# Patient Record
Sex: Female | Born: 1990 | Race: White | Hispanic: No | Marital: Single | Smoking: Never smoker
Health system: Southern US, Community
[De-identification: ages and names within clinical notes are randomized; demographics above are authoritative.]

---
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
## 2017-10-30 ENCOUNTER — Emergency Department (HOSPITAL_COMMUNITY)
Admission: EM | Admit: 2017-10-30 | Discharge: 2017-10-30 | Disposition: A | Discharge status: Home / Self Care | Payer: BLUE CROSS/BLUE SHIELD | Attending: Emergency Medicine | Admitting: Emergency Medicine

## 2017-10-30 ENCOUNTER — Encounter (HOSPITAL_COMMUNITY): Payer: Self-pay | Admitting: Emergency Medicine

## 2017-10-30 ENCOUNTER — Emergency Department (HOSPITAL_COMMUNITY): Payer: BLUE CROSS/BLUE SHIELD

## 2017-10-30 DIAGNOSIS — M25571 Pain in right ankle and joints of right foot: Secondary | ICD-10-CM | POA: Insufficient documentation

## 2017-10-30 DIAGNOSIS — M79671 Pain in right foot: Secondary | ICD-10-CM

## 2017-10-30 NOTE — ED Notes (Signed)
Patient able to ambulate independently with and without crutches  

## 2017-10-30 NOTE — ED Notes (Signed)
Ortho tech paged  

## 2017-10-30 NOTE — ED Triage Notes (Signed)
Pt presents to ED after falling through the toilet lit into the toilet in mid April.  Seen by orthopedist for foot, has had an MRI and xray and told "there's nothing wrong with your foot".  Patient states she continues to have swelling, regardless of whether she's on it or not.

## 2017-10-30 NOTE — ED Notes (Signed)
Ortho responded, coming to see patient

## 2017-10-30 NOTE — Progress Notes (Signed)
Orthopedic Tech Progress Note Patient Details:  Kathi LudwigJessica A Blodgett [birth year] [id number]  Ortho Devices Type of Ortho Device: ASO, Crutches Ortho Device/Splint Location: RLE Ortho Device/Splint Interventions: Ordered, Application, Adjustment   Post Interventions Patient Tolerated: Well Instructions Provided: Care of device   Jennye MoccasinHughes, Shauntea Lok Craig 10/30/2017, 10:34 PM

## 2017-10-30 NOTE — ED Provider Notes (Signed)
[facility]   CSN: [id number] Arrival date & time: 10/30/17  2048     History   Chief Complaint Chief Complaint  Patient presents with  . Foot Pain    HPI Melissa Nash is a 27 y.o. female with no significant past medical history presents today for evaluation of acute onset, waxing and waning right foot and ankle pain and swelling for 2 months.  On 08/25/2017 she sustained an injury in which her feet got caught in a toilet.  She was seen and evaluated on that day in the emergency department with complaint of right foot and ankle pain with no acute osseous abnormalities on x-ray but some soft tissue swelling noted.  She states that since then she has [place] and underwent [place] CT scan but states "they never told me with a shot".  She has not attempted to contact the group.  Since the injury she has had waxing and waning right ankle and foot pain and swelling.  Pain is localized to the dorsum of the right foot as well as the right heel and the medial aspect of the right ankle.  Pain worsens with ambulation and palpation.  Pain improves temporarily with Flector patches, NSAIDs, Tylenol, Epsom salts soaks and heating pads.  Also had some improvement with a foot brace but states that it got dirty so she stopped using it.  She denies fevers or chills.  She notes intermittent numbness and tingling of her toes which occurs primarily when she elevates the extremity.  Denies recent travel or surgeries, no hemoptysis, no prior history of DVT or PE.  She is on the Mirena IUD, no estrogen-based OCPs.  She did sustain a superficial sunburn to the bilateral lower extremities after kayaking on Saturday 5 days ago but states this is not of concern to her today.  The history is provided by the patient.    History reviewed. No pertinent past medical history.  There are no active problems to display for this  patient.   History reviewed. No pertinent surgical history.   OB History   None      Home Medications    Prior to Admission medications   Not on File    Family History History reviewed. No pertinent family history.  Social History Social History   Tobacco Use  . Smoking status: Never Smoker  . Smokeless tobacco: Never Used  Substance Use Topics  . Alcohol use: Not Currently  . Drug use: Never     Allergies   Penicillins   Review of Systems Review of Systems  Constitutional: Negative for chills and fever.  Respiratory: Negative for shortness of breath.   Cardiovascular: Positive for leg swelling. Negative for chest pain.  Neurological: Positive for numbness. Negative for syncope and weakness.     Physical Exam Updated Vital Signs BP (!) 128/92 (BP Location: Right Arm)   Pulse 76   Temp 98.5 F (36.9 C) (Oral)   Resp 18   SpO2 99%   Physical Exam  Constitutional: She appears well-developed and well-nourished. No distress.  HENT:  Head: Normocephalic and atraumatic.  Eyes: Conjunctivae are normal. Right eye exhibits no discharge. Left eye exhibits no discharge.  Neck: No JVD present. No tracheal deviation present.  Cardiovascular: Normal rate and intact distal pulses.  2+ radial and DP/PT pulses bl, negative Homan's bl, no lower extremity edema   Pulmonary/Chest: Effort normal.  Abdominal: She exhibits  no distension.  Musculoskeletal: Normal range of motion. She exhibits tenderness. She exhibits no edema.  There is tenderness to palpation of the right foot at the heel, the dorsum of the third and fourth metatarsals no deformity, crepitus, ecchymosis, or erythema noted.  5/5 strength BLE major muscle groups.  No varus or valgus instability.  Mild medial malleolar tenderness of the right ankle noted  Neurological: She is alert. She exhibits normal muscle tone.  Fluent speech with no evidence of dysarthria or aphasia, no facial droop, mildly altered  sensation to soft touch of the dorsum of the right foot.  Ambulatory with mildly antalgic gait but good balance, able to Heel Walk and Toe Walk without difficulty.  Skin: Skin is warm and dry. There is erythema.  Diffuse erythema to the anterior aspect of the bilateral lower extremities consistent with superficial sunburn.  No blisters.  Rash blanches.  No excoriations  Psychiatric: She has a normal mood and affect. Her behavior is normal.  Nursing note and vitals reviewed.    ED Treatments / Results  Labs (all labs ordered are listed, but only abnormal results are displayed) Labs Reviewed - No data to display  EKG None  Radiology Dg Ankle Complete Right  Result Date: 10/30/2017 CLINICAL DATA:  Right ankle pain for several months after fall. EXAM: RIGHT ANKLE - COMPLETE 3+ VIEW COMPARISON:  None. FINDINGS: There is no evidence of fracture, dislocation, or joint effusion. There is no evidence of arthropathy or other focal bone abnormality. Soft tissues are unremarkable. IMPRESSION: Negative. Electronically Signed   By: Tollie Eth M.D.   On: 10/30/2017 22:00   Dg Foot Complete Right  Result Date: 10/30/2017 CLINICAL DATA:  Medial right foot pain and swelling x2 months. EXAM: RIGHT FOOT COMPLETE - 3+ VIEW COMPARISON:  None. FINDINGS: Normal bone mineralization. No marginal nor extra articular erosions. No periostitis. No acute fracture nor suspicious osseous lesions. Soft tissues are unremarkable. IMPRESSION: No acute osseous abnormality of the right foot. Electronically Signed   By: Tollie Eth M.D.   On: 10/30/2017 21:59    Procedures Procedures (including critical care time)  Medications Ordered in ED Medications - No data to display   Initial Impression / Assessment and Plan / ED Course  I have reviewed the triage vital signs and the nursing notes.  Pertinent labs & imaging results that were available during my care of the patient were reviewed by me and considered in my medical  decision making (see chart for details).     Patient with right ankle and foot pain waxing and waning for 2 months.  Initial evaluation at an outside hospital showed no acute osseous abnormalities.  She is afebrile, vital signs are stable.  She is nontoxic in appearance, neurovascularly intact.  Pain appears to be osseous in nature, no concern for DVT, compartments are soft.  She has a superficial/first-degree sunburn to the bilateral lower extremities, not consistent with cellulitis.  No concern for septic joint.  She did undergo CT scan with an orthopedic group in [place] but never followed up on the results.  She is ambulatory on the extremity despite pain.  Repeat radiographs today show no acute osseous abnormalities.  There is some concern for possible plantar fasciitis given heel pain on examination.  She was given brace and crutches for comfort, encourage continued use of NSAIDs, Tylenol, ice, and elevation.  She will follow-up with an orthopedist for reevaluation of her symptoms.  Discussed strict ED return precautions.  Patient and  patient's significant other verbalized understanding of and agreement with plan and patient stable for discharge home at this time.  Final Clinical Impressions(s) / ED Diagnoses   Final diagnoses:  Acute right ankle pain  Right foot pain    ED Discharge Orders    None       Bennye Alm 10/31/17 1507    Benjiman Core, MD 11/01/17 1512

## 2017-10-30 NOTE — Discharge Instructions (Addendum)
Your x-rays today did not show any fractures.  Your physical examination is somewhat suggestive of plantar fasciitis.  You may alternate 600 mg of ibuprofen and [phone] mg of Tylenol every 3 hours as needed for pain for the next few days. Do not exceed 4000 mg of Tylenol daily.  Take ibuprofen with food to avoid upset stomach issues.  Do some gentle stretching exercises in the morning and throughout the day to help with your pain.  You can freeze a water bottle and then roll it gently over the bottom of your foot.  I have also attached instructions on how to tape your feet which can help ease your pain.  Wear the CAM walker/brace for comfort.  Crutches to help avoid weightbearing.  Follow-up with orthopedics for reevaluation of your symptoms.   Return to the emergency department if any concerning signs or symptoms develop such as fevers, severe swelling, loss of pulses or pallor to the extremity, or persistent numbness/weakness.

## 2017-10-30 NOTE — ED Notes (Signed)
Xray at bedside to transport patient.

## 2019-07-04 IMAGING — CR DG ANKLE COMPLETE 3+V*R*
3 series · 3 of 3 positions shown · non-contrast
Comparison: None.

CLINICAL DATA: Right ankle pain for several months after fall.

EXAM:
RIGHT ANKLE - COMPLETE 3+ VIEW

[ankle ap]
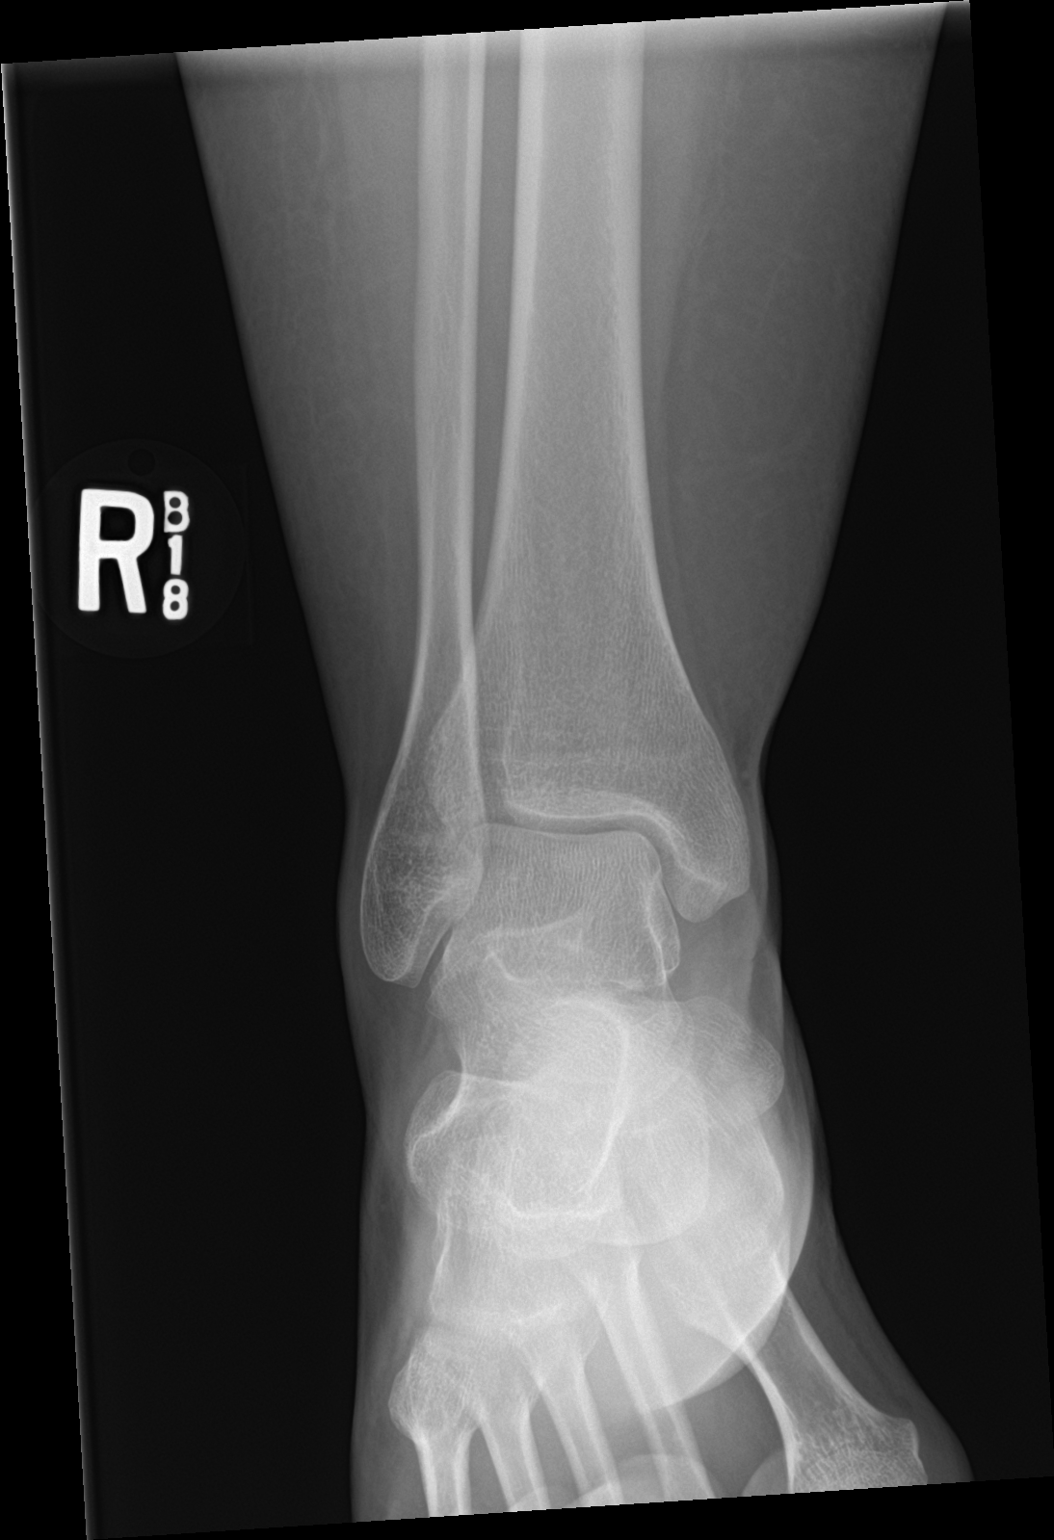

[ankle obl]
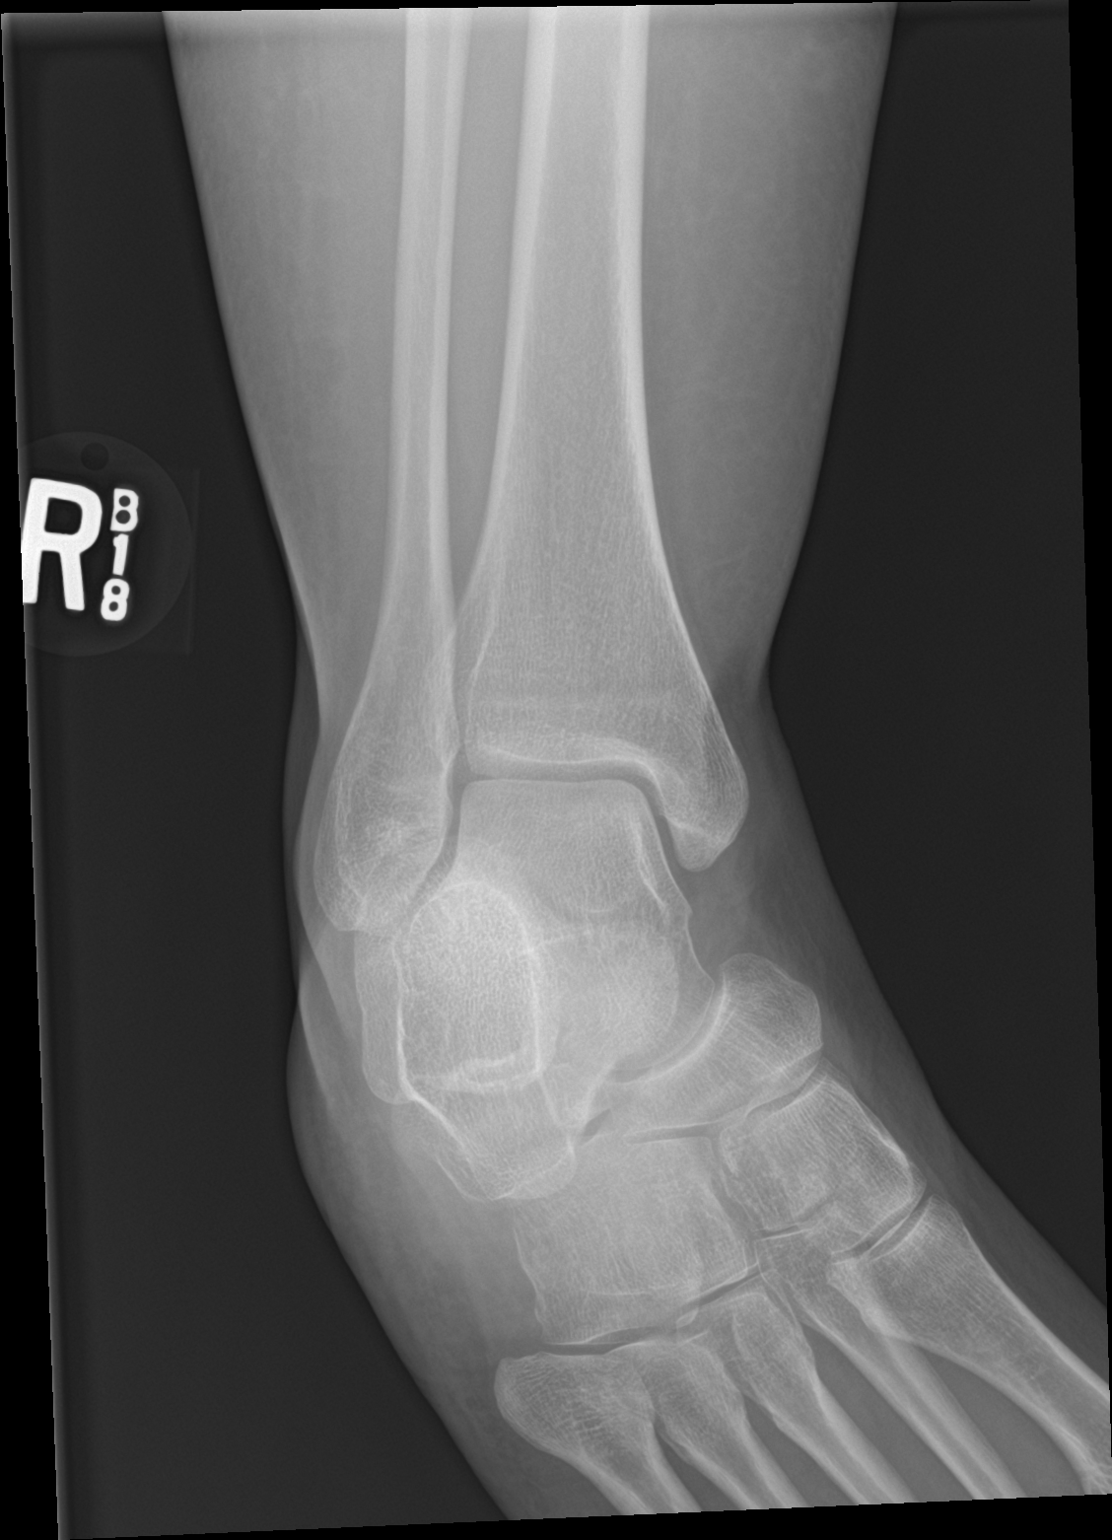

[ankle lat]
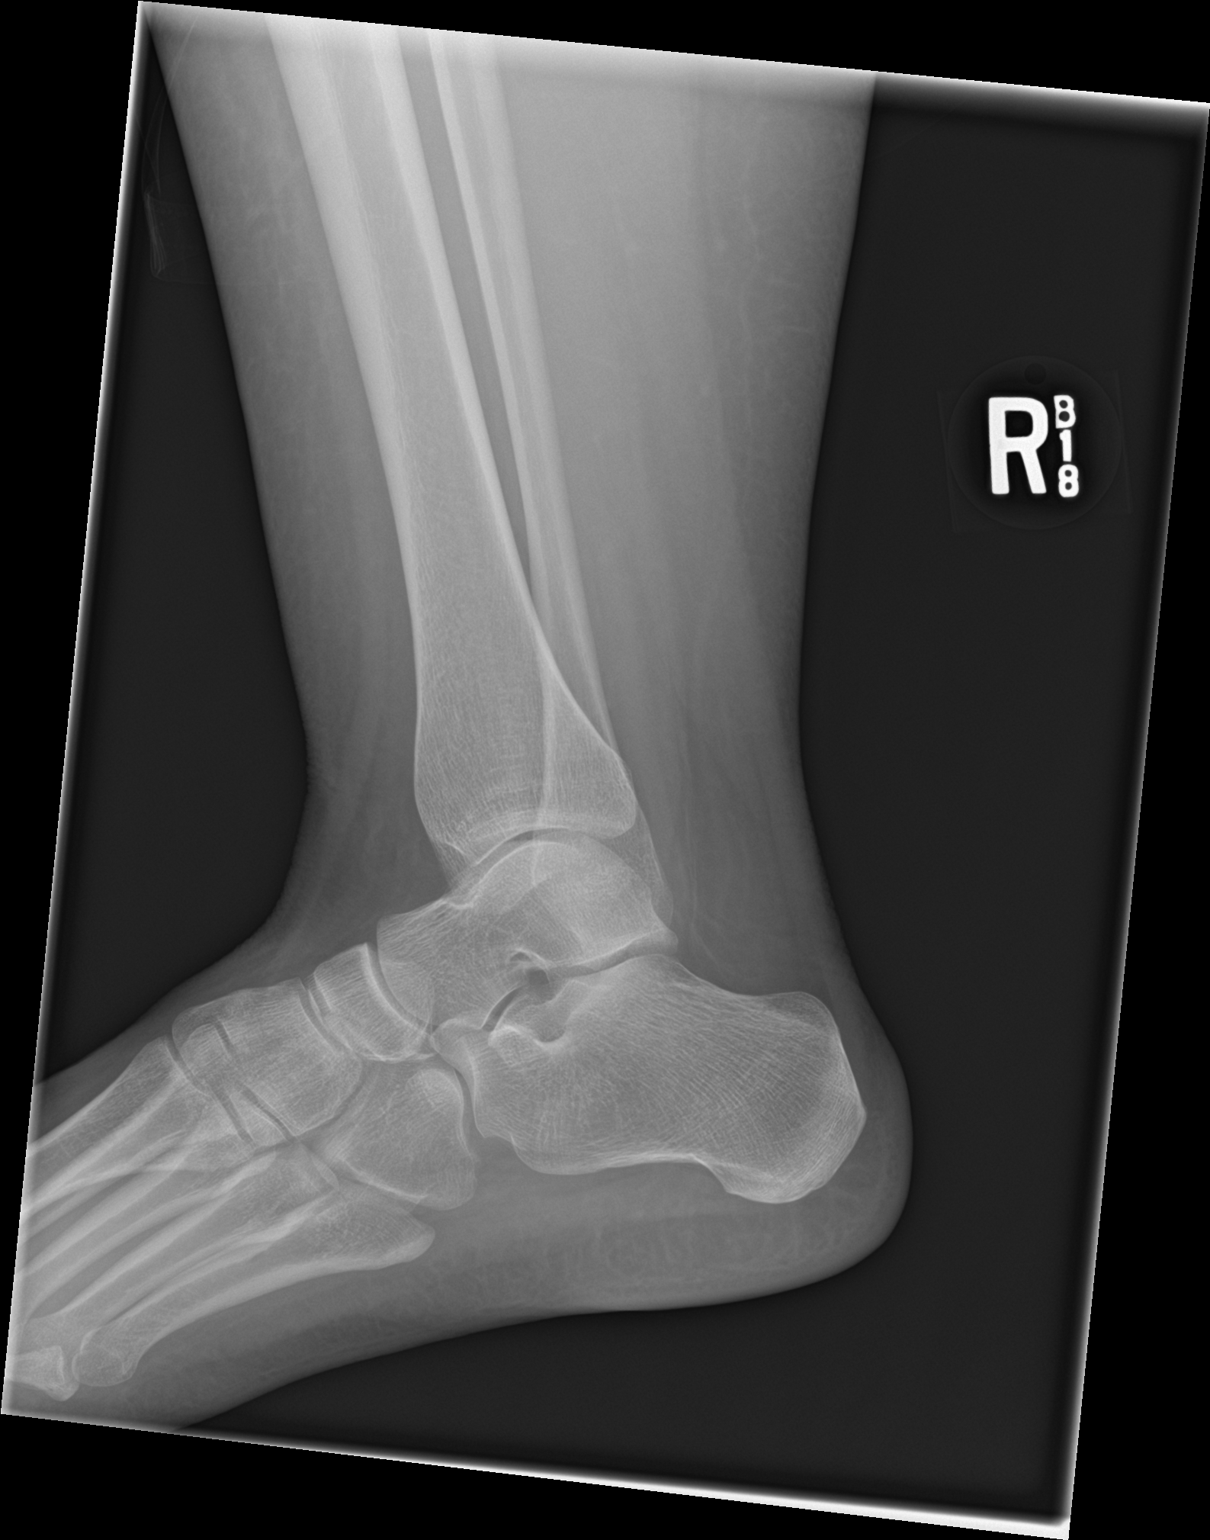

[3 of 3 positions shown; findings below may reference images not displayed]

FINDINGS: There is no evidence of fracture, dislocation, or joint effusion.
There is no evidence of arthropathy or other focal bone abnormality.
Soft tissues are unremarkable.
IMPRESSION: Negative.

## 2019-07-04 IMAGING — CR DG FOOT COMPLETE 3+V*R*
3 series · 3 of 3 positions shown · non-contrast
Comparison: None.

CLINICAL DATA: Medial right foot pain and swelling x2 months.

EXAM:
RIGHT FOOT COMPLETE - 3+ VIEW

[foot ap]
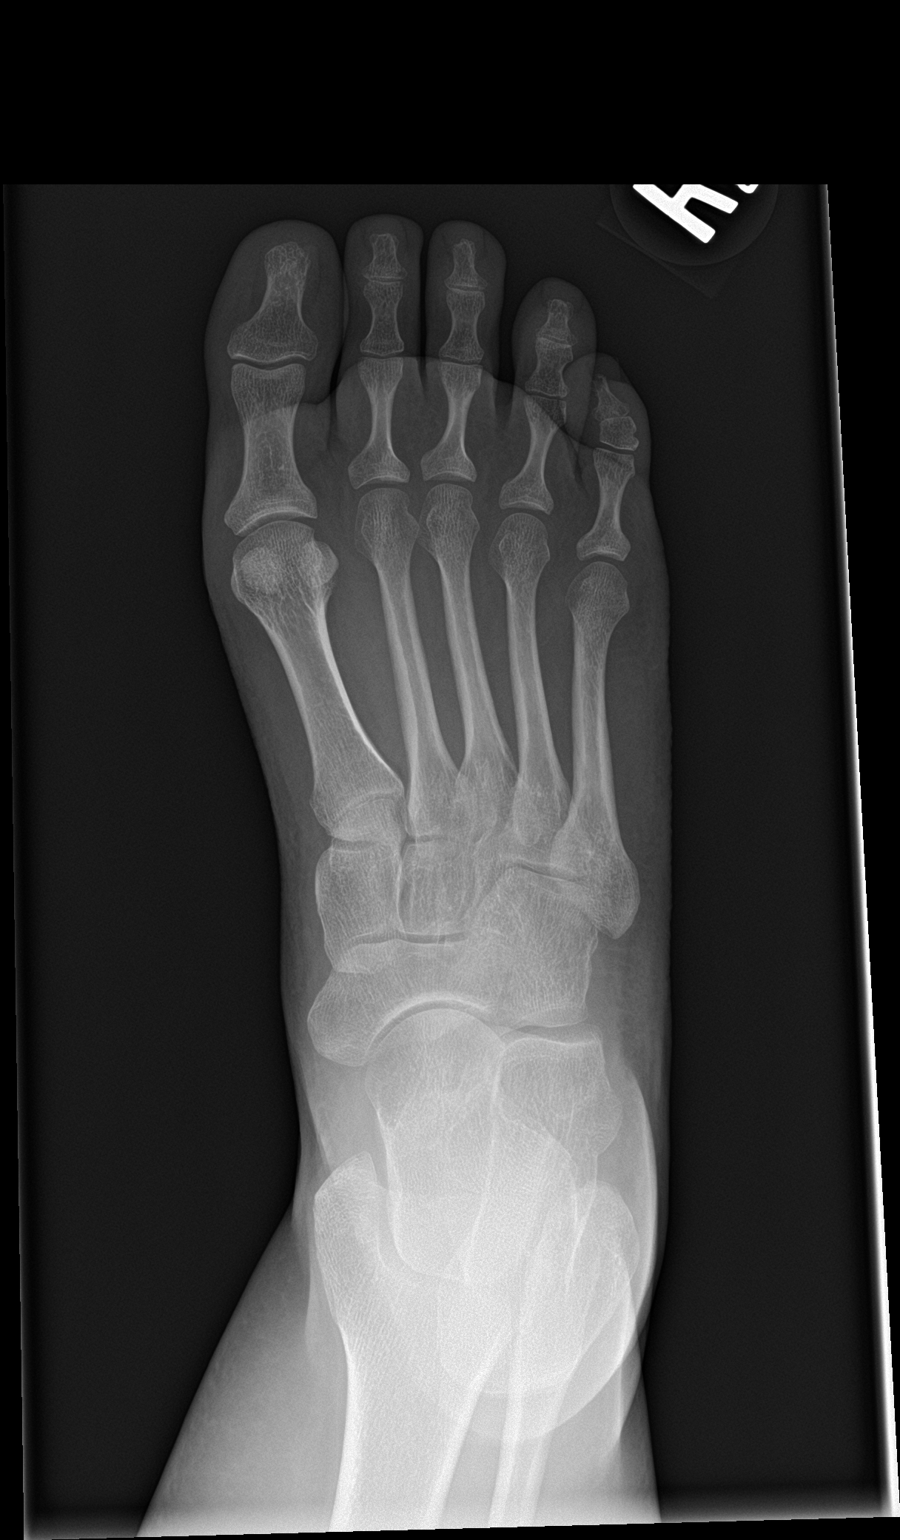

[foot obl]
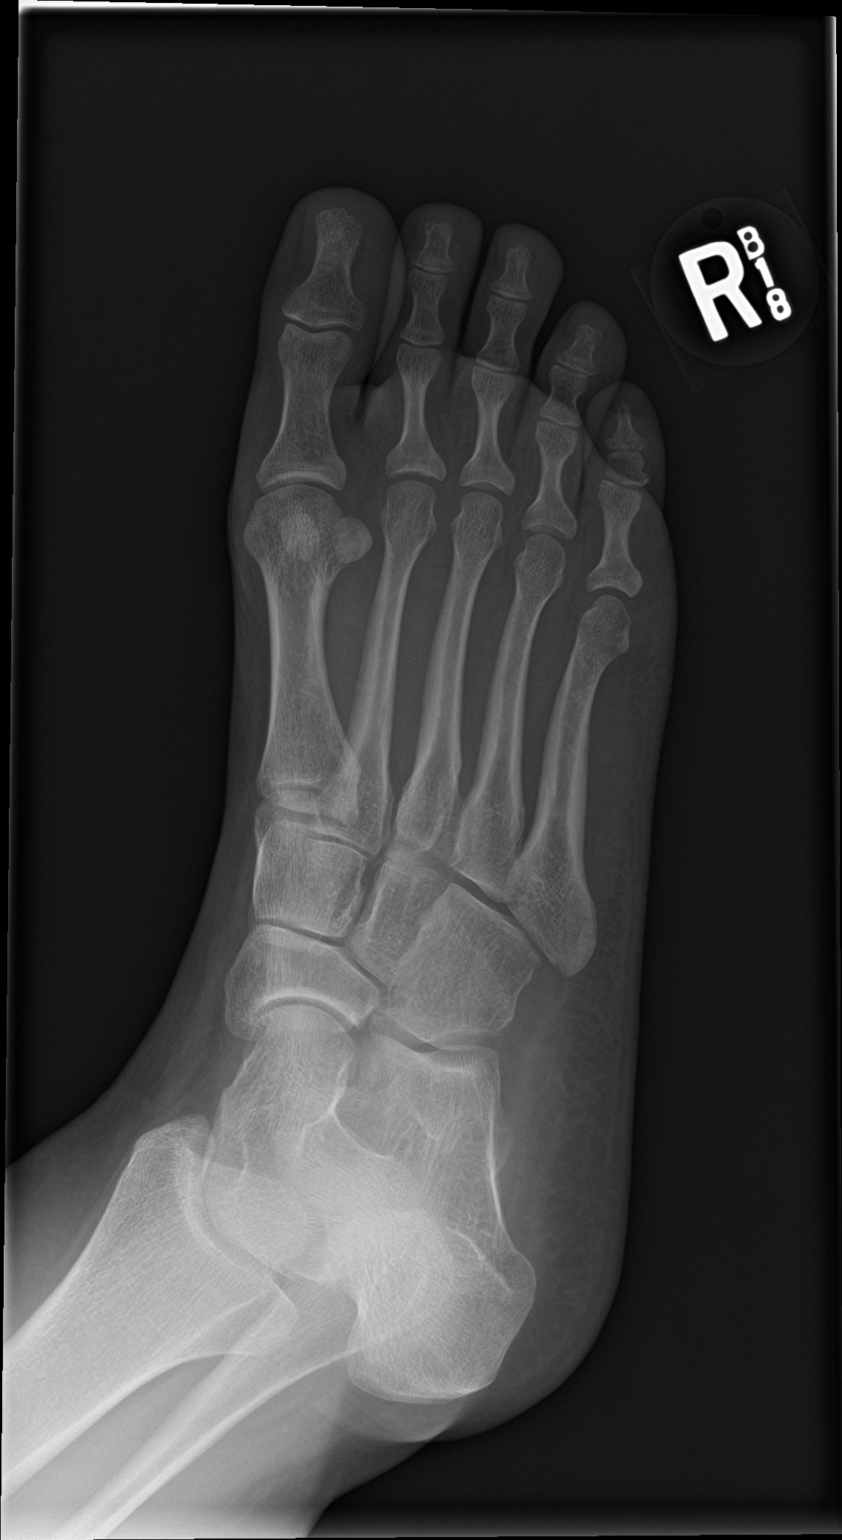

[foot lat]
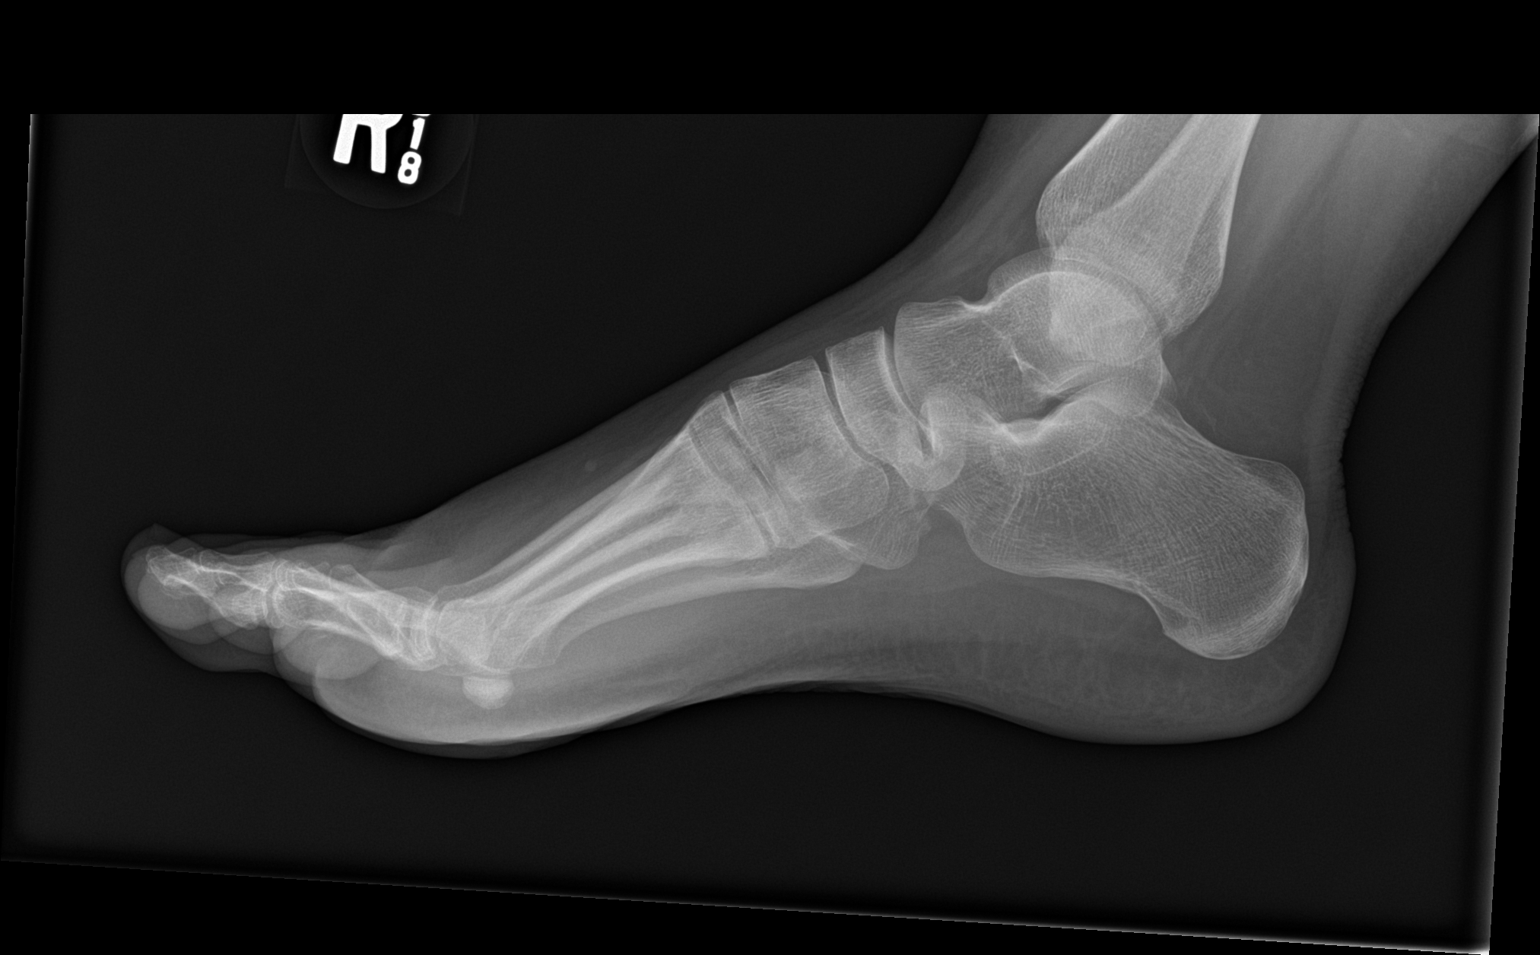

[3 of 3 positions shown; findings below may reference images not displayed]

FINDINGS: Normal bone mineralization. No marginal nor extra articular
erosions. No periostitis. No acute fracture nor suspicious osseous
lesions. Soft tissues are unremarkable.
IMPRESSION: No acute osseous abnormality of the right foot.
